# Patient Record
Sex: Male | Born: 1965 | Race: White | Hispanic: No | Marital: Married | Smoking: Never smoker
Health system: Southern US, Community
[De-identification: ages and names within clinical notes are randomized; demographics above are authoritative.]

## PROBLEM LIST (undated history)

## (undated) DIAGNOSIS — K209 Esophagitis, unspecified without bleeding: Secondary | ICD-10-CM

## (undated) DIAGNOSIS — K222 Esophageal obstruction: Secondary | ICD-10-CM

## (undated) DIAGNOSIS — C73 Malignant neoplasm of thyroid gland: Secondary | ICD-10-CM

## (undated) DIAGNOSIS — K219 Gastro-esophageal reflux disease without esophagitis: Secondary | ICD-10-CM

## (undated) DIAGNOSIS — K449 Diaphragmatic hernia without obstruction or gangrene: Secondary | ICD-10-CM

## (undated) HISTORY — DX: Esophageal obstruction: K22.2

## (undated) HISTORY — DX: Esophagitis, unspecified: K20.9

## (undated) HISTORY — DX: Malignant neoplasm of thyroid gland: C73

## (undated) HISTORY — DX: Diaphragmatic hernia without obstruction or gangrene: K44.9

## (undated) HISTORY — DX: Gastro-esophageal reflux disease without esophagitis: K21.9

## (undated) HISTORY — DX: Esophagitis, unspecified without bleeding: K20.90

---

## 1989-10-31 DIAGNOSIS — C73 Malignant neoplasm of thyroid gland: Secondary | ICD-10-CM

## 1989-10-31 HISTORY — PX: THYROIDECTOMY: SHX17

## 1989-10-31 HISTORY — DX: Malignant neoplasm of thyroid gland: C73

## 2004-09-27 ENCOUNTER — Ambulatory Visit: Payer: Self-pay | Admitting: Internal Medicine

## 2004-09-30 ENCOUNTER — Ambulatory Visit: Payer: Self-pay | Admitting: Internal Medicine

## 2006-01-10 ENCOUNTER — Ambulatory Visit: Payer: Self-pay | Admitting: Internal Medicine

## 2006-01-18 ENCOUNTER — Ambulatory Visit: Payer: Self-pay | Admitting: Internal Medicine

## 2006-02-23 ENCOUNTER — Ambulatory Visit: Payer: Self-pay | Admitting: Internal Medicine

## 2006-06-12 ENCOUNTER — Ambulatory Visit: Payer: Self-pay | Admitting: Internal Medicine

## 2006-10-31 HISTORY — PX: NECK DISSECTION: SUR422

## 2007-01-08 ENCOUNTER — Ambulatory Visit: Payer: Self-pay | Admitting: Internal Medicine

## 2007-01-30 ENCOUNTER — Ambulatory Visit: Payer: Self-pay | Admitting: *Deleted

## 2007-02-02 ENCOUNTER — Ambulatory Visit (HOSPITAL_COMMUNITY): Admission: RE | Admit: 2007-02-02 | Discharge: 2007-02-02 | Payer: Self-pay | Admitting: Internal Medicine

## 2007-02-20 ENCOUNTER — Encounter (INDEPENDENT_AMBULATORY_CARE_PROVIDER_SITE_OTHER): Payer: Self-pay | Admitting: *Deleted

## 2007-02-20 ENCOUNTER — Encounter: Admission: RE | Admit: 2007-02-20 | Discharge: 2007-02-20 | Payer: Self-pay | Admitting: Surgery

## 2007-02-20 ENCOUNTER — Other Ambulatory Visit: Admission: RE | Admit: 2007-02-20 | Discharge: 2007-02-20 | Payer: Self-pay | Admitting: Diagnostic Radiology

## 2007-03-12 ENCOUNTER — Encounter (HOSPITAL_COMMUNITY): Admission: RE | Admit: 2007-03-12 | Discharge: 2007-03-21 | Payer: Self-pay | Admitting: Otolaryngology

## 2007-03-21 ENCOUNTER — Encounter (INDEPENDENT_AMBULATORY_CARE_PROVIDER_SITE_OTHER): Payer: Self-pay | Admitting: Otolaryngology

## 2007-03-21 ENCOUNTER — Observation Stay (HOSPITAL_COMMUNITY): Admission: RE | Admit: 2007-03-21 | Discharge: 2007-03-22 | Payer: Self-pay | Admitting: Otolaryngology

## 2007-07-05 DIAGNOSIS — J309 Allergic rhinitis, unspecified: Secondary | ICD-10-CM | POA: Insufficient documentation

## 2007-07-05 DIAGNOSIS — K219 Gastro-esophageal reflux disease without esophagitis: Secondary | ICD-10-CM | POA: Insufficient documentation

## 2007-07-05 DIAGNOSIS — E039 Hypothyroidism, unspecified: Secondary | ICD-10-CM | POA: Insufficient documentation

## 2008-09-22 IMAGING — CT CT NECK W/O CM
4 of 5 series · 15 of 33 positions shown, 18 images · non-contrast
Comparison: none

HISTORY: Mass right neck/supraclavicular region, pain

[Series 3: shoulder 2.0 b60s · axial · 0.40mm/px · z∈[-104,-58]mm · 2 of 79 slices shown]
[im 27/79  bone]
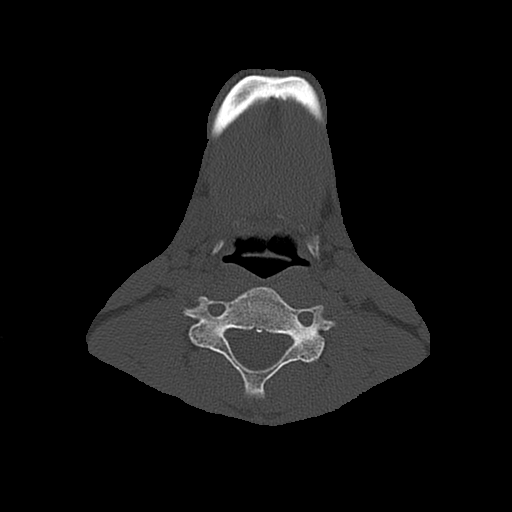
[im 53/79  bone]
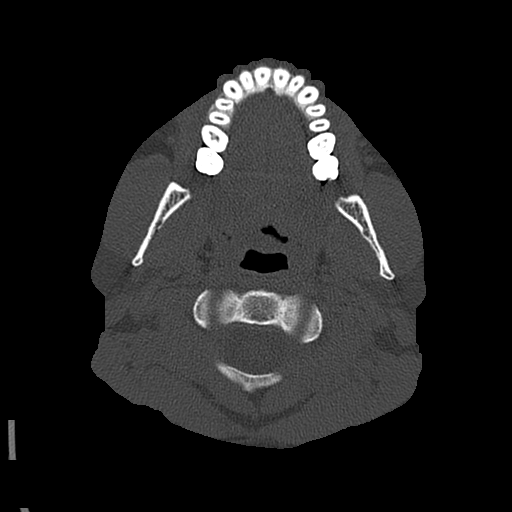

[Series 6: shoulder 2.0 b40s · axial · 0.40mm/px · z∈[-212,-52]mm · 5 of 135 slices shown, 7 images]
[im 23/135  soft-tissue]
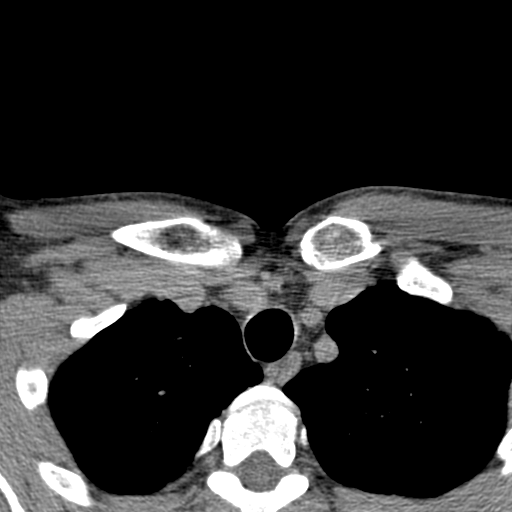
[im 23/135  bone]
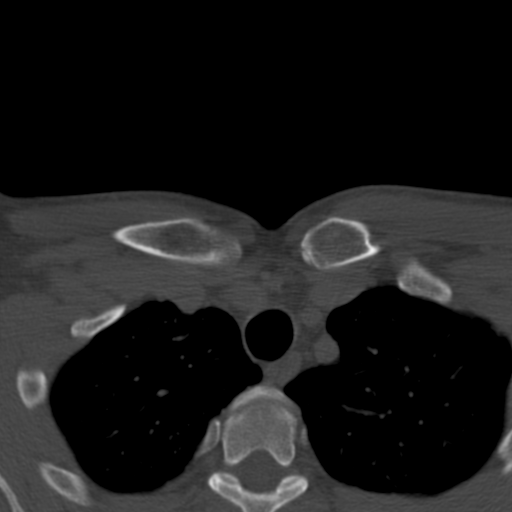
[im 45/135  bone]
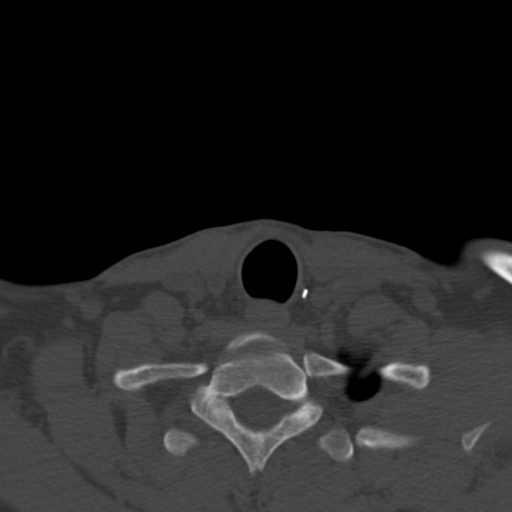
[im 68/135  bone]
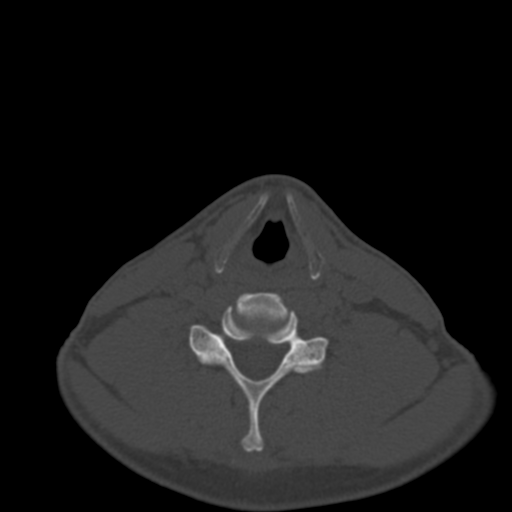
[im 90/135  bone]
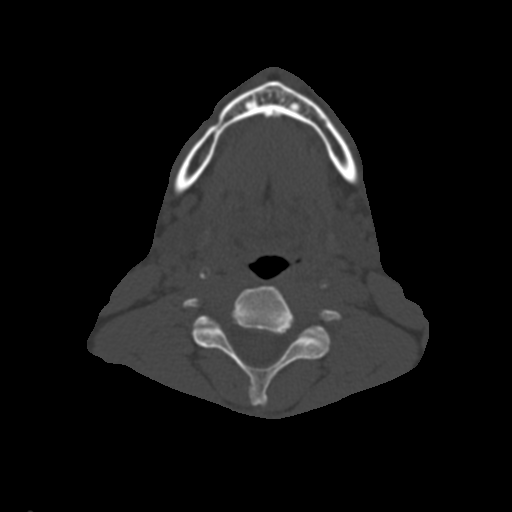
[im 112/135  soft-tissue]
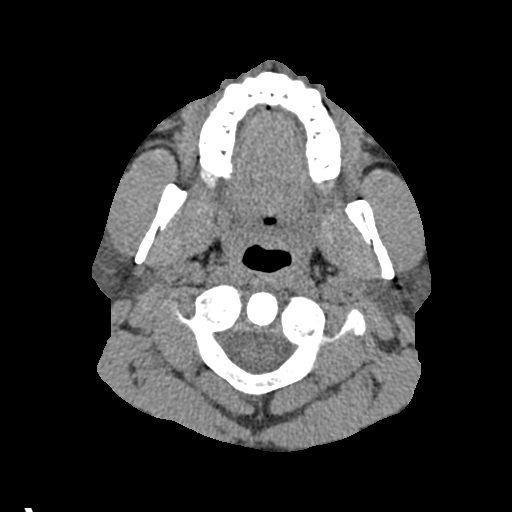
[im 112/135  bone]
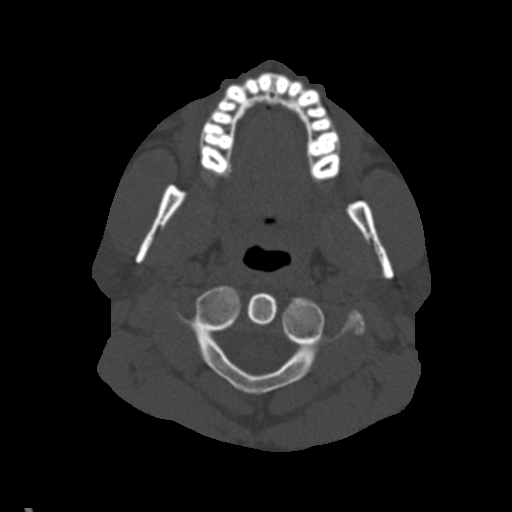

[Series 602: <mpr thick range> · coronal · 0.74mm/px · 3 of 86 slices shown]
[im 18/86  bone]
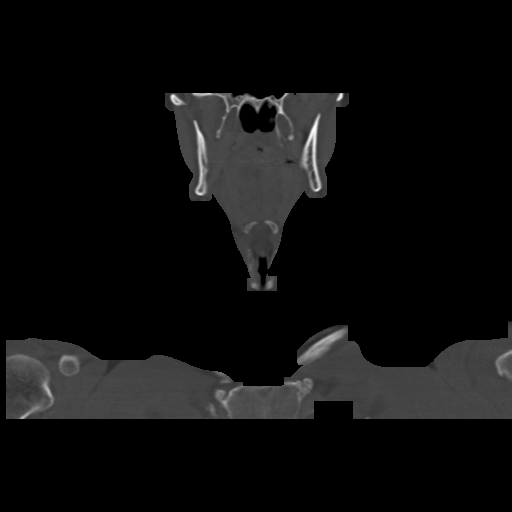
[im 35/86  bone]
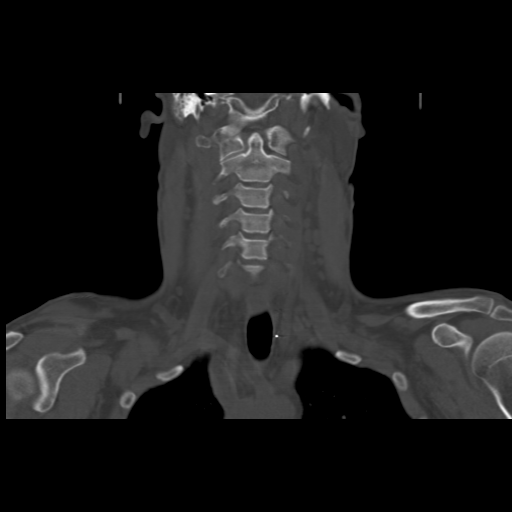
[im 52/86  bone]
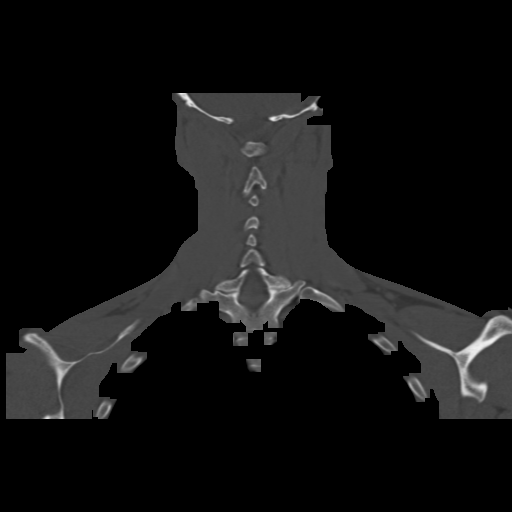

[Series 603: <mpr thick range(1)> · sagittal · 0.74mm/px · 5 of 179 slices shown, 6 images]
[im 60/179  bone]
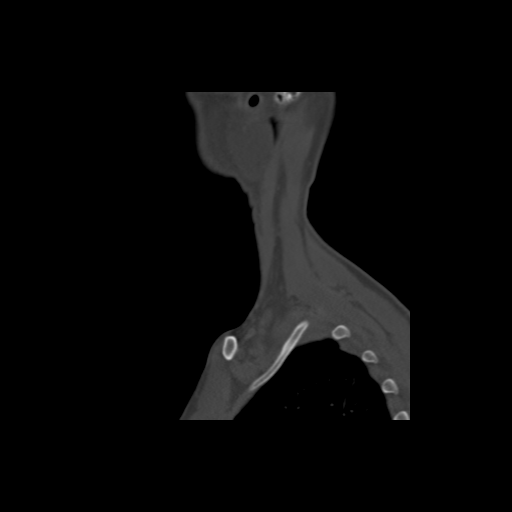
[im 75/179  bone]
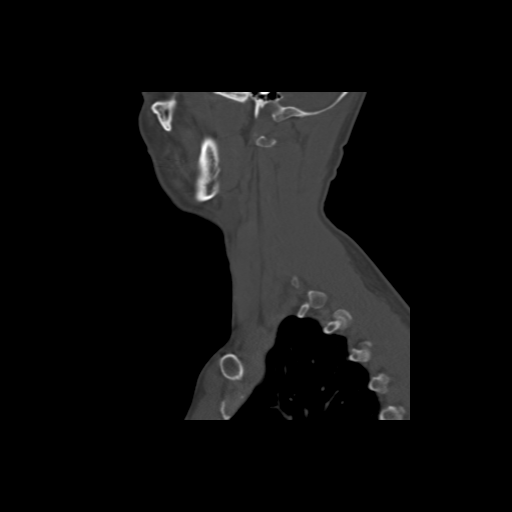
[im 90/179  soft-tissue]
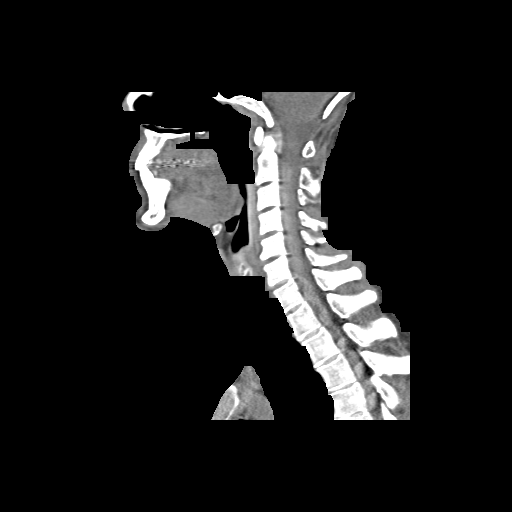
[im 90/179  bone]
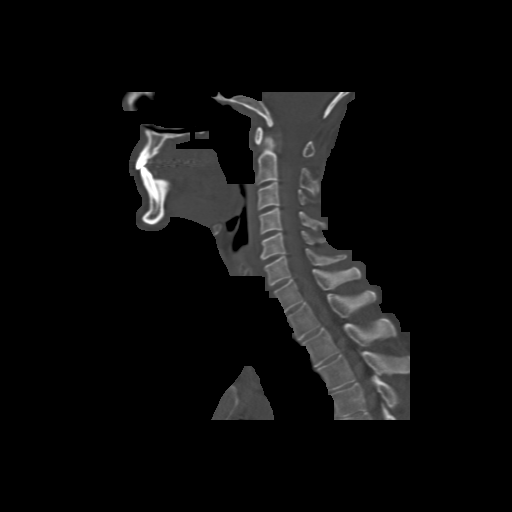
[im 104/179  bone]
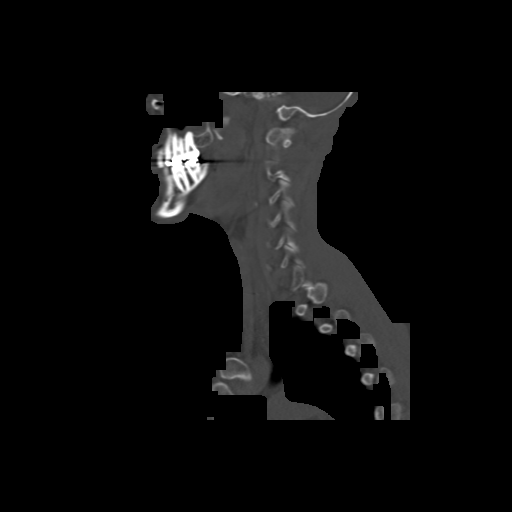
[im 119/179  bone]
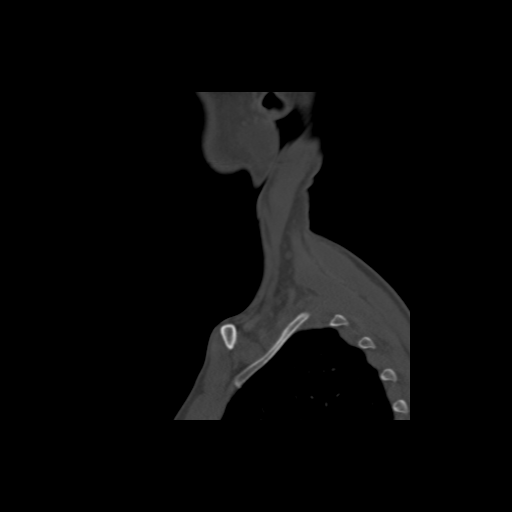

[15 of 33 positions shown; findings below may reference images not displayed]

CT NECK WITHOUT CONTRAST:

Multidetector noncontrast helical CT imaging neck performed without IV contrast
at referring physician request.
No prior exam for comparison.
Metallic marker placed at site of symptoms.

At site of marker in right subclavicular region, overlying distal right
clavicle, a small fat attenuation tumor is identified compatible with lipoma.
Lesion measures 2.9 cm transverse x 1.0 cm AP x 1.2 cm craniocaudal.
No soft tissue component identified.
Mass appears related to fascia of right trapezius muscle.
Status post thyroidectomy with surgical clips the thyroid bed.
Lung apices clear.
Mildly enlarged lymph node adjacent to left common carotid artery image 90, 12 x
10 mm.
Additional scattered normal and upper normal size cervical lymph nodes in
anterior chains bilaterally as well as submandibular.
Symmetric appearance of submandibular glands.
Tiny soft tissue nodule anterior aspect of left parotid gland, 8 x 5 mm image
13, question intraparotid lymph node versus tiny parotid nodule.
Mild prominence of parapharyngeal soft tissues without discrete mass or abscess.
Mucosal thickening left maxillary sinus question mucosal retention cyst.
No definite bone abnormality.
IMPRESSION: Small soft tissue lipoma associated with fascia of left right trapezius muscle,
2.9 cm in greatest size.
Mildly enlarged left carotid sheath lymph node in the lower left neck, uncertain
etiology.
In light of history of thyroid cancer, cannot exclude malignancy at this site.
This may be managed by  serial followup exam or potentially PET-CT or
radioactive 4-HOH imaging to assess for potential metastasis.
Question intraparotid lymph node versus tiny left parotid nodule.

## 2011-03-15 NOTE — Op Note (Signed)
NAME:  Chad West, Chad West              ACCOUNT NO.:  [id number]   MEDICAL RECORD NO.:  [id number]          PATIENT TYPE:  OBV   LOCATION:  2550                         FACILITY:  [facility]   PHYSICIAN:  Jefry H. Pollyann Kennedy, MD     DATE OF BIRTH:  30-Mar-1966   DATE OF PROCEDURE:  03/21/2007  DATE OF DISCHARGE:                               OPERATIVE REPORT   PREOPERATIVE DIAGNOSIS:  Metastatic thyroid carcinoma.   POSTOPERATIVE DIAGNOSIS:  Metastatic thyroid carcinoma.   PROCEDURE:  Left comprehensive neck dissection, including levels 1  through 5, with preservation of all nerves and other structures.   SECONDARY DIAGNOSIS:  Right shoulder lipoma.   SECONDARY PROCEDURE:  Excision of right shoulder lipoma.   COMPLICATIONS:  None.   ESTIMATED BLOOD LOSS:  Minimal.   FINDINGS:  An enlarged node in lobe level 4 on the left, approximately 2  cm and darkish coloration.  There were multiple other enlarged nodes  throughout all zones of the neck but none that were obviously  pathologic.   HISTORY:  45 year old healthy gentleman who was treated with a total  thyroidectomy about a 16 years prior in another city for papillary  thyroid carcinoma.  He was found recently on CT imaging to have an  incidental enlarged node in level four on the left.  A guided needle  biopsy was positive for metastatic papillary carcinoma.  The risks,  benefits, alternatives, and complications of procedure were explained to  the patient who seemed to understand and agreed to surgery.   PROCEDURE:  The patient was taken to the operating room and placed on  the operating room table in the supine position.  Following induction of  general endotracheal anesthesia, the patient was prepped and draped in a  standard fashion.   Procedure 1.  Excision of right shoulder lipoma.  A 2-cm incision was  created using electrocautery overlying the lipomatous mass.  Careful  dissection was used in subcutaneous plane to isolate the  lipoma and to  dissect it from surrounding tissue.  The lipoma was sent for pathologic  evaluation.  The incision was reapproximated using subcuticular chromic  and Steri-Strips on the skin.   Procedure 2.  Comprehensive neck dissection, left side.  An incision was  outlined from the mastoid tip down to the level of the cricoid and  across to the right side of the neck in a horizontal fashion.  Electrocautery was used to incise the skin and subcutaneous tissue.  The  platysma was divided.  Subplatysmal flaps were developed superiorly to  the mandible and inferiorly to the clavicle, exposing the trapezius  muscle.  A comprehensive dissection was accomplished, dissecting  fibrofatty and lymph node-bearing tissue from zones 1 through 5.  The  sternocleidomastoid muscle was identified and preserved.  The greater  auricular nerve was preserved.  The external jugular vein was  sacrificed.  The spinal accessory, the facial nerve, marginal mandibular  branch, hypoglossal nerve, lingual nerve, and vagus nerve were all  identified and preserved.  The lymphatic duct region was treated using  vascular clips to clip off any suspicious  lymphatic vessels.  The  submandibular triangle was dissected all the way up to the mandible and  anteriorly to the digastric on both sides.  The spinal accessory was  dissected from the base of skull down to the sternocleidomastoid muscle  and then posteriorly to the trapezius muscle.  The specimen was labeled  all five zones and sent for pathologic evaluation.  The wound was  irrigated with saline, and hemostasis was completed using silk ties and  bipolar cautery as needed.  The wound was closed in layers using chromic  suture on the platysmal layer and staples on the skin.  A 10 round JP  was left in the wound exiting through a separate stab incision and  secured in place with suture.   The patient was then awakened, extubated, and transferred to recovery in   stable condition.      Jefry H. Pollyann Kennedy, MD  Electronically Signed     JHR/MEDQ  D:  03/24/2007  T:  03/25/2007  Job:  [id number]   cc:   Ardeth Sportsman, MD

## 2013-04-09 ENCOUNTER — Encounter: Payer: Self-pay | Admitting: Gastroenterology

## 2013-05-06 ENCOUNTER — Ambulatory Visit (INDEPENDENT_AMBULATORY_CARE_PROVIDER_SITE_OTHER): Payer: 59 | Admitting: Gastroenterology

## 2013-05-06 ENCOUNTER — Encounter: Payer: Self-pay | Admitting: Gastroenterology

## 2013-05-06 VITALS — BP 116/76 | HR 76 | Ht 71.0 in | Wt 177.8 lb

## 2013-05-06 DIAGNOSIS — K219 Gastro-esophageal reflux disease without esophagitis: Secondary | ICD-10-CM

## 2013-05-06 DIAGNOSIS — R1319 Other dysphagia: Secondary | ICD-10-CM

## 2013-05-06 MED ORDER — OMEPRAZOLE 40 MG PO CPDR: 40.0000 mg | DELAYED_RELEASE_CAPSULE | Freq: Every day | ORAL | Status: AC

## 2013-05-06 NOTE — Patient Instructions (Addendum)
We have printed the prescription for omeprazole for you to mail to your mail order pharmacy.   It has been recommended to you by your physician that you have a(n) Endoscopy completed. Per your request, we did not schedule the procedure(s) today. Please contact our office at [phone] should you decide to have the procedure completed.  You will be due for a recall colonoscopy in 12/2013. We will send you a reminder in the mail when it gets closer to that time.  Thank you for choosing me and [facility].  Venita Lick. Pleas Koch., MD., Clementeen Graham  cc: Adrian Prince, MD

## 2013-05-06 NOTE — Progress Notes (Signed)
History of Present Illness: This is a 47 year old male with a history of GERD and an esophageal stricture. He underwent upper endoscopy with dilation in 1997. He has done well for many years on omeprazole 20 mg daily until he had an episode of a large pill temporarily lodging in his esophagus but he noted a couple episodes of dysphagia with chicken and rice. He increased omeprazole 40 mg daily and he has had no further symptoms. He did change to Dexilant 60 mg daily by Dr. Evlyn Kanner and his symptoms remain under control. Denies weight loss, abdominal pain, constipation, diarrhea, change in stool caliber, melena, hematochezia, nausea, vomiting, chest pain.  Review of Systems: Pertinent positive and negative review of systems were noted in the above HPI section. All other review of systems were otherwise negative.  Current Medications, Allergies, Past Medical History, Past Surgical History, Family History and Social History were reviewed in [employer] record.  Physical Exam: General: Well developed , well nourished, no acute distress Head: Normocephalic and atraumatic Eyes:  sclerae anicteric, EOMI Ears: Normal auditory acuity Mouth: No deformity or lesions Neck: Supple, no masses or thyromegaly Lungs: Clear throughout to auscultation Heart: Regular rate and rhythm; no murmurs, rubs or bruits Abdomen: Soft, non tender and non distended. No masses, hepatosplenomegaly or hernias noted. Normal Bowel sounds Musculoskeletal: Symmetrical with no gross deformities  Skin: No lesions on visible extremities Pulses:  Normal pulses noted Extremities: No clubbing, cyanosis, edema or deformities noted Neurological: Alert oriented x 4, grossly nonfocal Cervical Nodes:  No significant cervical adenopathy Inguinal Nodes: No significant inguinal adenopathy Psychological:  Alert and cooperative. Normal mood and affect  Assessment and Recommendations:  1. GERD with a history of an esophageal  stricture. No recent episodes of dysphagia since increasing his PPI therapy. After completing his course of Dexilant he will return to omeprazole 40 mg daily. Offered her the option of proceeding with upper endoscopy and possible dilation now or waiting until he has recurrent dysphagia. He states he like to consider these options and will contact us.  2.Colorectal cancer screening, average risk. Colonoscopy at age 63.

## 2013-10-01 ENCOUNTER — Encounter (HOSPITAL_COMMUNITY): Payer: Self-pay | Admitting: Emergency Medicine

## 2013-10-01 ENCOUNTER — Emergency Department (INDEPENDENT_AMBULATORY_CARE_PROVIDER_SITE_OTHER)
Admission: EM | Admit: 2013-10-01 | Discharge: 2013-10-01 | Disposition: A | Discharge status: Home / Self Care | Payer: Self-pay | Source: Home / Self Care

## 2013-10-01 DIAGNOSIS — M542 Cervicalgia: Secondary | ICD-10-CM
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
## 2013-10-02 NOTE — ED Provider Notes (Signed)
Medical screening examination/treatment/procedure(s) were performed by a resident physician or non-physician practitioner and as the supervising physician I was immediately available for consultation/collaboration.  Clementeen Graham, MD    Rodolph Bong, MD 10/02/13 [phone]

## 2013-11-22 ENCOUNTER — Encounter: Payer: Self-pay | Admitting: Gastroenterology

## 2014-01-20 ENCOUNTER — Encounter: Payer: Self-pay | Admitting: Internal Medicine

## 2014-03-12 ENCOUNTER — Ambulatory Visit (AMBULATORY_SURGERY_CENTER): Payer: Self-pay | Admitting: *Deleted

## 2014-03-12 ENCOUNTER — Telehealth: Payer: Self-pay | Admitting: *Deleted

## 2014-03-12 VITALS — Ht 71.0 in | Wt 182.6 lb

## 2014-03-12 DIAGNOSIS — K219 Gastro-esophageal reflux disease without esophagitis: Secondary | ICD-10-CM

## 2014-03-12 DIAGNOSIS — Z1211 Encounter for screening for malignant neoplasm of colon: Secondary | ICD-10-CM

## 2014-03-12 MED ORDER — PREPOPIK 10-3.5-12 MG-GM-GM PO PACK: 1.0000 | PACK | Freq: Once | ORAL | Status: DC

## 2014-03-12 NOTE — Progress Notes (Signed)
No allergies to eggs or soy. No problems with anesthesia.  Pt given Emmi instructions for colonoscopy  No oxygen use  No diet drug use  

## 2014-03-12 NOTE — Telephone Encounter (Signed)
Dr Fuller Plan: pt was here today for PV for screening colonoscopy.  Pt has hx of GERD and dysphagia.  t  Per your office notes 05/06/13 "GERD with a history of an esophageal stricture. No recent episodes of dysphagia since increasing his PPI therapy. After completing his course of Dexilant he will return to omeprazole 40 mg daily. Offered her the option of proceeding with upper endoscopy and possible dilation now or waiting until he has recurrent dysphagia. He states he like to consider these options and will contact us. "  Pt says that he has not had any dysphagia for 6 months but would like to have EGD at time of colonoscopy. Last EGD 1997.  He has occasional breakthrough heartburn taking Prilosec 40 mg daily. I scheduled pt for Proporfol EGD/Colon while he was here for PV.  Please let me know if I need to make changes.  Thanks, Juliann Pulse

## 2014-03-12 NOTE — Telephone Encounter (Signed)
noted 

## 2014-03-12 NOTE — Telephone Encounter (Signed)
OK for EGD to evaluate chronic GERD.

## 2014-03-19 ENCOUNTER — Encounter: Payer: Self-pay | Admitting: Gastroenterology

## 2014-03-26 ENCOUNTER — Encounter: Payer: 59 | Admitting: Gastroenterology

## 2014-04-25 ENCOUNTER — Ambulatory Visit (AMBULATORY_SURGERY_CENTER): Payer: 59 | Admitting: Gastroenterology

## 2014-04-25 ENCOUNTER — Encounter: Payer: Self-pay | Admitting: Gastroenterology

## 2014-04-25 VITALS — BP 107/68 | HR 77 | Temp 98.4°F | Resp 11 | Ht 71.0 in | Wt 182.0 lb

## 2014-04-25 DIAGNOSIS — R1319 Other dysphagia: Secondary | ICD-10-CM

## 2014-04-25 DIAGNOSIS — Z1211 Encounter for screening for malignant neoplasm of colon: Secondary | ICD-10-CM

## 2014-04-25 DIAGNOSIS — K219 Gastro-esophageal reflux disease without esophagitis: Secondary | ICD-10-CM

## 2014-04-25 DIAGNOSIS — K222 Esophageal obstruction: Secondary | ICD-10-CM

## 2014-04-25 MED ORDER — SODIUM CHLORIDE 0.9 % IV SOLN: 500.0000 mL | INTRAVENOUS | Status: DC

## 2014-04-25 NOTE — Patient Instructions (Signed)
YOU HAD AN ENDOSCOPIC PROCEDURE TODAY AT [facility]: Refer to the procedure report that was given to you for any specific questions about what was found during the examination.  If the procedure report does not answer your questions, please call your gastroenterologist to clarify.  If you requested that your care partner not be given the details of your procedure findings, then the procedure report has been included in a sealed envelope for you to review at your convenience later.  YOU SHOULD EXPECT: Some feelings of bloating in the abdomen. Passage of more gas than usual.  Walking can help get rid of the air that was put into your GI tract during the procedure and reduce the bloating. If you had a lower endoscopy (such as a colonoscopy or flexible sigmoidoscopy) you may notice spotting of blood in your stool or on the toilet paper. If you underwent a bowel prep for your procedure, then you may not have a normal bowel movement for a few days.  DIET:  You may have clear liquids until 4:30pm.   Then you can proceed to a soft diet for the rest of today.  You may resume your regular diet tomorrow.  Drink plenty of fluids but you should avoid alcoholic beverages for 24 hours.  ACTIVITY: Your care partner should take you home directly after the procedure.  You should plan to take it easy, moving slowly for the rest of the day.  You can resume normal activity the day after the procedure however you should NOT DRIVE or use heavy machinery for 24 hours (because of the sedation medicines used during the test).    SYMPTOMS TO REPORT IMMEDIATELY: A gastroenterologist can be reached at any hour.  During normal business hours, 8:30 AM to 5:00 PM Monday through Friday, call [phone].  After hours and on weekends, please call the GI answering service at [phone] who will take a message and have the physician on call contact you.   Following lower endoscopy (colonoscopy or flexible  sigmoidoscopy):  Excessive amounts of blood in the stool  Significant tenderness or worsening of abdominal pains  Swelling of the abdomen that is new, acute  Fever of 100F or higher  Following upper endoscopy (EGD)  Vomiting of blood or coffee ground material  New chest pain or pain under the shoulder blades  Painful or persistently difficult swallowing  New shortness of breath  Fever of 100F or higher  Black, tarry-looking stools  FOLLOW UP: If any biopsies were taken you will be contacted by phone or by letter within the next 1-3 weeks.  Call your gastroenterologist if you have not heard about the biopsies in 3 weeks.  Our staff will call the home number listed on your records the next business day following your procedure to check on you and address any questions or concerns that you may have at that time regarding the information given to you following your procedure. This is a courtesy call and so if there is no answer at the home number and we have not heard from you through the emergency physician on call, we will assume that you have returned to your regular daily activities without incident.  SIGNATURES/CONFIDENTIALITY: You and/or your care partner have signed paperwork which will be entered into your electronic medical record.  These signatures attest to the fact that that the information above on your After Visit Summary has been reviewed and is understood.  Full responsibility of the confidentiality of this  discharge information lies with you and/or your care-partner.  Continue to take your PPI, and try to increase the fiber in your diet.  Please, read the handouts given to you by your recovery room nurse.

## 2014-04-25 NOTE — Progress Notes (Signed)
A/ox3, pleased with MAC, report to RN 

## 2014-04-25 NOTE — Progress Notes (Signed)
Called to room to assist during endoscopic procedure.  Patient ID and intended procedure confirmed with present staff. Received instructions for my participation in the procedure from the performing physician.  

## 2014-04-25 NOTE — Op Note (Signed)
[place]  [address] [place], [id number]   COLONOSCOPY PROCEDURE REPORT  PATIENT: Chad, West  MR#: [id number] BIRTHDATE: 1966/03/31 , 48  yrs. old GENDER: Male ENDOSCOPIST: Ladene Artist, MD, [facility] REFERRED GL:OVFIEPP Forde Dandy, M.D. PROCEDURE DATE:  04/25/2014 PROCEDURE:   Colonoscopy, screening First Screening Colonoscopy - Avg.  risk and is 50 yrs.  old or older - No.  Prior Negative Screening - Now for repeat screening. N/A  History of Adenoma - Now for follow-up colonoscopy & has been > or = to 3 yrs.  N/A  Polyps Removed Today? No.  Recommend repeat exam, <10 yrs? No. ASA CLASS:   Class II INDICATIONS:average risk screening. MEDICATIONS: MAC sedation, administered by CRNA and propofol (Diprivan) 320mg  IV DESCRIPTION OF PROCEDURE:   After the risks benefits and alternatives of the procedure were thoroughly explained, informed consent was obtained.  A digital rectal exam revealed no abnormalities of the rectum.   The LB [id number]  endoscope was introduced through the anus and advanced to the cecum, which was identified by both the appendix and ileocecal valve. No adverse events experienced.   The quality of the prep was [place] good The instrument was then slowly withdrawn as the colon was fully examined.  COLON FINDINGS: Mild diverticulosis was noted at the cecum, in the ascending colon, transverse colon, descending colon, and sigmoid colon.   The colon was otherwise normal.  There was no diverticulosis, inflammation, polyps or cancers unless previously stated.  Retroflexed views revealed small internal hemorrhoids. The time to cecum=2 minutes 10 seconds.  Withdrawal time=10 minutes 33 seconds.  The scope was withdrawn and the procedure completed.  COMPLICATIONS: There were no complications.  ENDOSCOPIC IMPRESSION: 1.   Mild diverticulosis at the cecum, in the ascending, transverse, descending, and sigmoid colon 2.   Small internal  hemorrhoids  RECOMMENDATIONS: 1.  High fiber diet with liberal fluid intake. 2.  You should continue to follow colorectal cancer screening guidelines for "routine risk" patients with a repeat colonoscopy in 10 years.  There is no need for routine, screening FOBT (stool) testing for at least 5 years.  eSigned:  Ladene Artist, MD, [facility] 04/25/2014 3:13 PM

## 2014-04-25 NOTE — Op Note (Signed)
[place]  [address] [place], [id number]   ENDOSCOPY PROCEDURE REPORT  PATIENT: Chad West, Chad West  MR#: [id number] BIRTHDATE: 03/10/1966 , 48  yrs. old GENDER: Male ENDOSCOPIST: Ladene Artist, MD, [facility] REFERRED BY:  Reynold Bowen, M.D. PROCEDURE DATE:  04/25/2014 PROCEDURE:  EGD, diagnostic and Savary dilation of esophagus ASA CLASS:     Class II INDICATIONS:  History of esophageal reflux.   Dysphagia. MEDICATIONS: MAC sedation, administered by CRNA, There was residual sedation effect present from prior procedure, and propofol (Diprivan) 280mg  IV TOPICAL ANESTHETIC: none DESCRIPTION OF PROCEDURE: After the risks benefits and alternatives of the procedure were thoroughly explained, informed consent was obtained.  The LB [id number] endoscope was introduced through the mouth and advanced to the second portion of the duodenum. Without limitations.  The instrument was slowly withdrawn as the mucosa was fully examined.  ESOPHAGUS: A stricture was found at the gastroesophageal junction. It was 12 mm in diameter. The stenosis was traversable with the endoscope.   The esophagus was otherwise normal. STOMACH: The mucosa and folds of the stomach appeared normal. DUODENUM: The duodenal mucosa showed no abnormalities in the bulb and second portion of the duodenum.  Retroflexed views revealed a small hiatal hernia. A guidewire was placed and the scope was then withdrawn. 13, 14 and 15 mm Savary dilator were passed with minimal resistance and minimal heme on last 2 dilators. The guidewire and last dilator were removed from the patient and the procedure completed.  COMPLICATIONS: There were no complications.  ENDOSCOPIC IMPRESSION: 1.   Stricture at the gastroesophageal junction 2.   Small hiatal hernia  RECOMMENDATIONS: 1.  Anti-reflux regimen long term 2.  Continue PPI long term 3.  post dilation instructions  eSigned:  Ladene Artist, MD, [facility]  04/25/2014 3:23 PM

## 2014-04-28 ENCOUNTER — Telehealth: Payer: Self-pay | Admitting: *Deleted

## 2014-04-28 NOTE — Telephone Encounter (Signed)
  Follow up Call-  Call back number 04/25/2014  Post procedure Call Back phone  # [phone] hm  Permission to leave phone message Yes     No answer, left message.
